# Patient Record
Sex: Female | Born: 1955 | Race: White | Hispanic: No | Marital: Married | Smoking: Former smoker
Health system: Southern US, Community
[De-identification: ages and names within clinical notes are randomized; demographics above are authoritative.]

## PROBLEM LIST (undated history)

## (undated) DIAGNOSIS — E079 Disorder of thyroid, unspecified: Secondary | ICD-10-CM

## (undated) HISTORY — DX: Disorder of thyroid, unspecified: E07.9

## (undated) HISTORY — PX: TUBAL LIGATION: SHX77

---

## 1998-06-19 ENCOUNTER — Ambulatory Visit (HOSPITAL_COMMUNITY): Admission: RE | Admit: 1998-06-19 | Discharge: 1998-06-19 | Payer: Self-pay | Admitting: Obstetrics and Gynecology

## 1998-06-19 ENCOUNTER — Encounter: Payer: Self-pay | Admitting: Obstetrics and Gynecology

## 1998-06-28 ENCOUNTER — Ambulatory Visit (HOSPITAL_COMMUNITY): Admission: RE | Admit: 1998-06-28 | Discharge: 1998-06-28 | Payer: Self-pay | Admitting: Obstetrics and Gynecology

## 1998-06-28 ENCOUNTER — Encounter: Payer: Self-pay | Admitting: Obstetrics and Gynecology

## 1998-08-27 ENCOUNTER — Other Ambulatory Visit: Admission: RE | Admit: 1998-08-27 | Discharge: 1998-08-27 | Payer: Self-pay | Admitting: Obstetrics and Gynecology

## 1999-03-07 ENCOUNTER — Encounter: Payer: Self-pay | Admitting: Obstetrics and Gynecology

## 1999-03-07 ENCOUNTER — Ambulatory Visit (HOSPITAL_COMMUNITY): Admission: RE | Admit: 1999-03-07 | Discharge: 1999-03-07 | Payer: Self-pay | Admitting: Obstetrics and Gynecology

## 2000-12-01 ENCOUNTER — Other Ambulatory Visit: Admission: RE | Admit: 2000-12-01 | Discharge: 2000-12-01 | Payer: Self-pay | Admitting: Obstetrics and Gynecology

## 2002-03-08 ENCOUNTER — Other Ambulatory Visit: Admission: RE | Admit: 2002-03-08 | Discharge: 2002-03-08 | Payer: Self-pay | Admitting: Obstetrics and Gynecology

## 2002-10-18 ENCOUNTER — Encounter: Admission: RE | Admit: 2002-10-18 | Discharge: 2003-01-16 | Payer: Self-pay

## 2002-12-12 ENCOUNTER — Encounter: Payer: Self-pay | Admitting: Obstetrics and Gynecology

## 2002-12-12 ENCOUNTER — Ambulatory Visit (HOSPITAL_COMMUNITY): Admission: RE | Admit: 2002-12-12 | Discharge: 2002-12-12 | Payer: Self-pay | Admitting: Obstetrics and Gynecology

## 2003-03-22 ENCOUNTER — Encounter: Admission: RE | Admit: 2003-03-22 | Discharge: 2003-06-20 | Payer: Self-pay

## 2003-11-16 ENCOUNTER — Other Ambulatory Visit: Admission: RE | Admit: 2003-11-16 | Discharge: 2003-11-16 | Payer: Self-pay | Admitting: Obstetrics and Gynecology

## 2005-08-04 ENCOUNTER — Ambulatory Visit (HOSPITAL_COMMUNITY): Admission: RE | Admit: 2005-08-04 | Discharge: 2005-08-04 | Payer: Self-pay | Admitting: Obstetrics and Gynecology

## 2005-12-01 ENCOUNTER — Other Ambulatory Visit: Admission: RE | Admit: 2005-12-01 | Discharge: 2005-12-01 | Payer: Self-pay | Admitting: Obstetrics and Gynecology

## 2007-03-31 ENCOUNTER — Ambulatory Visit (HOSPITAL_COMMUNITY): Admission: RE | Admit: 2007-03-31 | Discharge: 2007-03-31 | Payer: Self-pay | Admitting: Obstetrics and Gynecology

## 2007-05-30 ENCOUNTER — Other Ambulatory Visit: Admission: RE | Admit: 2007-05-30 | Discharge: 2007-05-30 | Payer: Self-pay | Admitting: Obstetrics and Gynecology

## 2009-08-30 ENCOUNTER — Ambulatory Visit (HOSPITAL_COMMUNITY): Admission: RE | Admit: 2009-08-30 | Discharge: 2009-08-30 | Payer: Self-pay | Admitting: Obstetrics and Gynecology

## 2010-05-28 ENCOUNTER — Encounter: Admission: RE | Admit: 2010-05-28 | Discharge: 2010-05-28 | Payer: Self-pay | Admitting: Family Medicine

## 2010-08-03 ENCOUNTER — Encounter: Payer: Self-pay | Admitting: Obstetrics and Gynecology

## 2012-12-13 ENCOUNTER — Other Ambulatory Visit: Payer: Self-pay | Admitting: Obstetrics and Gynecology

## 2012-12-13 DIAGNOSIS — Z1231 Encounter for screening mammogram for malignant neoplasm of breast: Secondary | ICD-10-CM

## 2012-12-15 ENCOUNTER — Ambulatory Visit (HOSPITAL_COMMUNITY)
Admission: RE | Admit: 2012-12-15 | Discharge: 2012-12-15 | Disposition: A | Discharge status: Home / Self Care | Payer: BC Managed Care – PPO | Source: Ambulatory Visit | Attending: Obstetrics and Gynecology | Admitting: Obstetrics and Gynecology

## 2012-12-15 DIAGNOSIS — Z1231 Encounter for screening mammogram for malignant neoplasm of breast: Secondary | ICD-10-CM

## 2012-12-19 ENCOUNTER — Other Ambulatory Visit: Payer: Self-pay | Admitting: Obstetrics and Gynecology

## 2012-12-19 DIAGNOSIS — R928 Other abnormal and inconclusive findings on diagnostic imaging of breast: Secondary | ICD-10-CM

## 2012-12-21 ENCOUNTER — Telehealth: Payer: Self-pay | Admitting: Obstetrics and Gynecology

## 2012-12-21 NOTE — Telephone Encounter (Signed)
Pt would like results of mammogram

## 2012-12-21 NOTE — Telephone Encounter (Signed)
Patient has received call from [facility] regarding need for additional images and she had some concerns.  Follow up appt is not until 01-03-13.  She did not feel like she received much information about this process from [facility] center and she was told that our office provides final results so she wanted to know when she could expect those.  Also with questions regarding what will be done at follow up appt.  Advised that appt usually includes time for a breast ultrasound to be done if needed and that although I cant answer how soon she will get results, they will come from [facility] and they are usually sensitive to the anxiety patients feel while waiting so they are efficient with results.  Advised she can call with questions or concerns and we can facilitate when/if needed.

## 2013-01-03 ENCOUNTER — Ambulatory Visit
Admission: RE | Admit: 2013-01-03 | Discharge: 2013-01-03 | Disposition: A | Discharge status: Home / Self Care | Payer: BC Managed Care – PPO | Source: Ambulatory Visit | Attending: Obstetrics and Gynecology | Admitting: Obstetrics and Gynecology

## 2013-01-03 DIAGNOSIS — R928 Other abnormal and inconclusive findings on diagnostic imaging of breast: Secondary | ICD-10-CM

## 2015-09-26 ENCOUNTER — Encounter: Payer: Self-pay | Admitting: Obstetrics and Gynecology

## 2015-09-26 ENCOUNTER — Ambulatory Visit (INDEPENDENT_AMBULATORY_CARE_PROVIDER_SITE_OTHER): Payer: BLUE CROSS/BLUE SHIELD | Admitting: Obstetrics and Gynecology

## 2015-09-26 VITALS — BP 92/60 | HR 64 | Resp 14 | Ht 69.5 in | Wt 183.0 lb

## 2015-09-26 DIAGNOSIS — Z01419 Encounter for gynecological examination (general) (routine) without abnormal findings: Secondary | ICD-10-CM | POA: Diagnosis not present

## 2015-09-26 DIAGNOSIS — Z124 Encounter for screening for malignant neoplasm of cervix: Secondary | ICD-10-CM

## 2015-09-26 NOTE — Progress Notes (Signed)
Patient ID: Barbara Black, female   DOB: [birth year], 60 y.o.   MRN: [id number] 60 y.o. [id number] MarriedCaucasianF here for annual exam.  She is sexually active without penetration, husband with ED. She has noticed her vagina is tighter and dryer, not a problem. No vaginal bleeding. No vasomotor symptoms.  She is sensitive to cold. She does have a thyroid condition and is on thyroid medication. Primary follows, levels are normal.     No LMP recorded. Patient is postmenopausal.          Sexually active: yes The current method of family planning is tubal ligation.    Exercising: Yes.    walking Smoker:  former  Health Maintenance: Pap:  2013 WNL per patient History of abnormal Pap:  no MMG:  07/2015 WNL per patient Colonoscopy:  2010 polyps, due at 62 BMD:   Never TDaP:  Unsure, she will check with her primary.  Gardasil: N/A   reports that she has quit smoking. Her smoking use included Cigarettes. She has a 5 pack-year smoking history. She has never used smokeless tobacco. She reports that she drinks alcohol. She reports that she does not use illicit drugs.Just occasional ETOH. 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 2 grown sons, 4 grandchildren.   Past Medical History  Diagnosis Date  . Thyroid disease     Past Surgical History  Procedure Laterality Date  . Cesarean section    . Tubal ligation      Current Outpatient Prescriptions  Medication Sig Dispense Refill  . thyroid (ARMOUR THYROID) 30 MG tablet      No current facility-administered medications for this visit.    Family History  Problem Relation Age of Onset  . Congestive Heart Failure Mother   . Cancer Father   . Stomach cancer Father   . Cancer Brother   Brother had myasthenia gravis, died at 19  Review of Systems  Constitutional: Negative.   HENT: Negative.   Eyes: Negative.   Respiratory: Negative.   Cardiovascular: Negative.   Gastrointestinal: Negative.   Endocrine: Negative.    Genitourinary: Negative.   Musculoskeletal: Negative.   Skin: Negative.   Allergic/Immunologic: Negative.   Neurological: Negative.   Psychiatric/Behavioral: Negative.   Rare GSI with a URI  Exam:   BP 92/60 mmHg  Pulse 64  Resp 14  Ht 5' 9.5" (1.765 m)  Wt 183 lb (83.008 kg)  BMI 26.65 kg/m2  Weight change: @ Height:   Height: 5' 9.5" (176.5 cm)  Ht Readings from Last 3 Encounters:  09/26/15 5' 9.5" (1.765 m)    General appearance: alert, cooperative and appears stated age Head: Normocephalic, without obvious abnormality, atraumatic Neck: no adenopathy, supple, symmetrical, trachea midline and thyroid normal to inspection and palpation Lungs: clear to auscultation bilaterally Breasts: normal appearance, no masses or tenderness Heart: regular rate and rhythm Abdomen: soft, non-tender; bowel sounds normal; no masses,  no organomegaly Extremities: extremities normal, atraumatic, no cyanosis or edema Skin: Skin color, texture, turgor normal. No rashes or lesions Lymph nodes: Cervical, supraclavicular, and axillary nodes normal. No abnormal inguinal nodes palpated Neurologic: Grossly normal   Pelvic: External genitalia:  no lesions              Urethra:  normal appearing urethra with no masses, tenderness or lesions              Bartholins and Skenes: normal                 Vagina: atrophic  Cervix: no lesions and stenotic               Bimanual Exam:  Uterus:  normal size, contour, position, consistency, mobility, non-tender and anteverted              Adnexa: no mass, fullness, tenderness               Rectovaginal: Confirms               Anus:  normal sphincter tone, no lesions  Chaperone was present for exam.  A:  Well Woman with normal exam  P:   Pap with hpv  Mammogram and colonoscopy are UTD  Discussed breast self exam  Discussed calcium and vit D intake  Labs and immunizations with primary MD

## 2015-09-26 NOTE — Patient Instructions (Signed)

## 2015-10-02 LAB — IPS PAP TEST WITH HPV

## 2016-05-04 ENCOUNTER — Telehealth: Payer: Self-pay | Admitting: Obstetrics and Gynecology

## 2016-05-04 NOTE — Telephone Encounter (Signed)
patient experiencing rt breast pain.  Says there is no lump and the pain comes and goes.  Says it almost feels like a pulled muscle.

## 2016-05-04 NOTE — Telephone Encounter (Signed)
Spoke with patient. Patient states that last week she began to notice left right breast discomfort. "It is not pain, but it is noticeable." Reports this sensation occurs intermittently. Nipple is more sensitive. Denies nipple discharge, swelling, redness, or lumps. Advised she will need to be seen in the office for further evaluation. Patient is agreeable. Appointment scheduled for 05/07/2016 at 11:30 am with Dr. Oscar LaJertson. Patient is agreeable to date and time. Aware if new symptoms develop or symptoms worsen she will need to be seen earlier for evaluation. Patient is agreeable.  Routing to provider for final review. Patient agreeable to disposition. Will close encounter.

## 2016-05-07 ENCOUNTER — Ambulatory Visit (INDEPENDENT_AMBULATORY_CARE_PROVIDER_SITE_OTHER): Payer: BLUE CROSS/BLUE SHIELD | Admitting: Obstetrics and Gynecology

## 2016-05-07 ENCOUNTER — Encounter: Payer: Self-pay | Admitting: Obstetrics and Gynecology

## 2016-05-07 VITALS — BP 114/78 | HR 76 | Resp 16 | Ht 69.5 in | Wt 188.0 lb

## 2016-05-07 DIAGNOSIS — E349 Endocrine disorder, unspecified: Secondary | ICD-10-CM | POA: Diagnosis not present

## 2016-05-07 DIAGNOSIS — N644 Mastodynia: Secondary | ICD-10-CM

## 2016-05-07 DIAGNOSIS — L680 Hirsutism: Secondary | ICD-10-CM | POA: Diagnosis not present

## 2016-05-07 DIAGNOSIS — R7989 Other specified abnormal findings of blood chemistry: Secondary | ICD-10-CM

## 2016-05-07 NOTE — Patient Instructions (Signed)

## 2016-05-07 NOTE — Progress Notes (Signed)
GYNECOLOGY  VISIT   HPI: 60 y.o.   Married  Caucasian  female   G2P2002 with Patient's last menstrual period was 04/13/2007 (approximate).   here for right breast soreness on and off x 1 week.   She has pain off and on in her right breast. She has been doing yoga and doesn't know if she strained a muscle. The discomfort is random, almost feels like a pulled muscle, like an ache.  She also had a brief sensation of like she did prior to menopause in her lower abdomen, resolved. No vaginal bleeding.  She has noticed some thinning of her hair, some hair on her face in the last few months. No voice changes. No clitoral changes. She had blood work done at [facility]. This included an elevated free testosterone, normal total testosterone, low vit d, borderline HgbA1C and elevated lipids (labs scanned).   GYNECOLOGIC HISTORY: Patient's last menstrual period was 04/13/2007 (approximate). Contraception: post menopausal  Menopausal hormone therapy: None        OB History    Gravida Para Term Preterm AB Living   2 2 2     2    SAB TAB Ectopic Multiple Live Births           1         There are no active problems to display for this patient.   Past Medical History:  Diagnosis Date  . Thyroid disease     Past Surgical History:  Procedure Laterality Date  . CESAREAN SECTION    . TUBAL LIGATION      Current Outpatient Prescriptions  Medication Sig Dispense Refill  . ascorbic acid (NATURAL C/ROSE HIPS) 500 MG tablet Take 500 mg by mouth daily.    . Biotin 5000 MCG CAPS Take by mouth daily.    . Cholecalciferol (VITAMIN D3) 5000 units CAPS Take by mouth daily.    . Coenzyme Q10 (CO Q 10) 100 MG CAPS Take by mouth daily.    . Cyanocobalamin (VITAMIN B 12 PO) Take 5,000 mcg by mouth daily.    . Gum Guggul, Commiphora mukul, (GUGGUL PLEX PO) Take by mouth daily.    . milk thistle 175 MG tablet Take 175 mg by mouth daily.    . Multiple Vitamin (MULTIVITAMIN) tablet Take 1 tablet by mouth  daily.    . Nutritional Supplements (ADRENAL COMPLEX PO) Take by mouth daily.    . Omega-3 Fatty Acids (FISH OIL) 500 MG CAPS Take by mouth.    . Probiotic Product (PROBIOTIC ACIDOPHILUS BEADS) CAPS Take by mouth daily.    Marland Kitchen thyroid (ARMOUR THYROID) 30 MG tablet      No current facility-administered medications for this visit.      ALLERGIES: Biaxin [clarithromycin] and Codeine  Family History  Problem Relation Age of Onset  . Congestive Heart Failure Mother   . Cancer Father   . Stomach cancer Father   . Cancer Brother     Social History   Social History  . Marital status: Married    Spouse name: N/A  . Number of children: N/A  . Years of education: N/A   Occupational History  . Not on file.   Social History Main Topics  . Smoking status: Former Smoker    Packs/day: 0.50    Years: 10.00    Types: Cigarettes  . Smokeless tobacco: Never Used  . Alcohol use 0.0 oz/week     Comment: rare  . Drug use: No  . Sexual activity:  Not Currently    Partners: Male   Other Topics Concern  . Not on file   Social History Narrative  . No narrative on file    Review of Systems  Constitutional: Positive for weight loss.  HENT: Negative.   Eyes: Negative.   Respiratory: Negative.   Cardiovascular: Negative.   Gastrointestinal: Negative.   Genitourinary: Negative.   Musculoskeletal: Negative.   Skin:       Right Breast Soreness/ pain  Neurological: Negative.   Endo/Heme/Allergies: Negative.   Psychiatric/Behavioral: Negative.     PHYSICAL EXAMINATION:    BP 114/78 (BP Location: Right Arm, Patient Position: Sitting, Cuff Size: Normal)   Pulse 76   Resp 16   Ht 5' 9.5" (1.765 m)   Wt 188 lb (85.3 kg)   LMP 04/13/2007 (Approximate)   BMI 27.36 kg/m     General appearance: alert, cooperative and appears stated age Breasts: no lumps, dimpling or skin changes. She is tender in the upper outer quadrant of the right breast, also tender with palpation of the chest wall  above the breast.  Lymph: no supraclavicular or axillary adenopathy Skin: no clear hirsutism on her face (she removes), moderate hair noted around her nipples bilaterally.  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  15 minutes face to face time of which over 50% was spent in counseling.

## 2016-05-08 LAB — DHEA-SULFATE: DHEA-SO4: 180 ug/dL (ref 8–188)

## 2016-05-10 LAB — 17-HYDROXYPROGESTERONE: 17-OH-Progesterone, LC/MS/MS: 10 ng/dL

## 2016-05-12 ENCOUNTER — Telehealth: Payer: Self-pay | Admitting: *Deleted

## 2016-05-12 NOTE — Telephone Encounter (Signed)
Left message to call regarding lab results -eh 

## 2016-05-12 NOTE — Telephone Encounter (Signed)
-----   Message from Romualdo BolkJill Evelyn Jertson, MD sent at 05/11/2016  2:27 PM EDT ----- Please advise the patient of normal results. Testosterone levels are pending

## 2016-05-13 NOTE — Telephone Encounter (Signed)
Patient is returning a call to Elaine. °

## 2016-05-15 LAB — TESTOS,TOTAL,FREE AND SHBG (FEMALE)
Sex Hormone Binding Glob.: 33 nmol/L (ref 14–73)
Testosterone, Free: 2.8 pg/mL (ref 0.1–6.4)
Testosterone,Total,LC/MS/MS: 20 ng/dL (ref 2–45)

## 2016-05-18 NOTE — Telephone Encounter (Signed)
Please let the patient know that I did review her mammogram reports. No significant changes were noted between 2011, 2014 and 2017. I would recommend that she do yearly 3D mammograms

## 2016-05-18 NOTE — Telephone Encounter (Signed)
Spoke with patient and advised that all her results were normal including her testosterone levels. Patient voiced understanding -eh

## 2016-05-18 NOTE — Telephone Encounter (Signed)
When talking with patient about test results she asked about her last two mammograms. She states that Dr. Oscar LaJertson was going to review them both and compare them. Her last two were from 2014 and 2017. Advised patient that I will send a message to Dr. Oscar LaJertson for review -eh

## 2016-05-18 NOTE — Telephone Encounter (Signed)
Spoke with patient and gave message from Dr. Jertson. Patient voiced understanding -eh 

## 2016-06-18 ENCOUNTER — Emergency Department (HOSPITAL_COMMUNITY): Payer: BLUE CROSS/BLUE SHIELD

## 2016-06-18 ENCOUNTER — Encounter (HOSPITAL_COMMUNITY): Payer: Self-pay | Admitting: Emergency Medicine

## 2016-06-18 ENCOUNTER — Emergency Department (HOSPITAL_COMMUNITY)
Admission: EM | Admit: 2016-06-18 | Discharge: 2016-06-18 | Disposition: A | Discharge status: Home / Self Care | Payer: BLUE CROSS/BLUE SHIELD | Attending: Emergency Medicine | Admitting: Emergency Medicine

## 2016-06-18 DIAGNOSIS — Z87891 Personal history of nicotine dependence: Secondary | ICD-10-CM | POA: Diagnosis not present

## 2016-06-18 DIAGNOSIS — E0580 Other thyrotoxicosis without thyrotoxic crisis or storm: Secondary | ICD-10-CM | POA: Diagnosis not present

## 2016-06-18 DIAGNOSIS — R791 Abnormal coagulation profile: Secondary | ICD-10-CM | POA: Diagnosis not present

## 2016-06-18 DIAGNOSIS — H539 Unspecified visual disturbance: Secondary | ICD-10-CM

## 2016-06-18 DIAGNOSIS — Z79899 Other long term (current) drug therapy: Secondary | ICD-10-CM | POA: Insufficient documentation

## 2016-06-18 DIAGNOSIS — H538 Other visual disturbances: Secondary | ICD-10-CM | POA: Diagnosis present

## 2016-06-18 LAB — CBC
HCT: 47.3 % — ABNORMAL HIGH (ref 36.0–46.0)
Hemoglobin: 15.8 g/dL — ABNORMAL HIGH (ref 12.0–15.0)
MCH: 27.3 pg (ref 26.0–34.0)
MCHC: 33.4 g/dL (ref 30.0–36.0)
MCV: 81.8 fL (ref 78.0–100.0)
PLATELETS: 255 10*3/uL (ref 150–400)
RBC: 5.78 MIL/uL — AB (ref 3.87–5.11)
RDW: 13.6 % (ref 11.5–15.5)
WBC: 9.4 10*3/uL (ref 4.0–10.5)

## 2016-06-18 LAB — URINALYSIS, ROUTINE W REFLEX MICROSCOPIC
Bacteria, UA: NONE SEEN
Bilirubin Urine: NEGATIVE
Glucose, UA: NEGATIVE mg/dL
HGB URINE DIPSTICK: NEGATIVE
Ketones, ur: NEGATIVE mg/dL
Nitrite: NEGATIVE
PH: 7 (ref 5.0–8.0)
Protein, ur: NEGATIVE mg/dL
SPECIFIC GRAVITY, URINE: 1.005 (ref 1.005–1.030)
Squamous Epithelial / LPF: NONE SEEN

## 2016-06-18 LAB — APTT: APTT: 28 s (ref 24–36)

## 2016-06-18 LAB — DIFFERENTIAL
BASOS PCT: 1 %
Basophils Absolute: 0.1 10*3/uL (ref 0.0–0.1)
Eosinophils Absolute: 0.2 10*3/uL (ref 0.0–0.7)
Eosinophils Relative: 2 %
LYMPHS PCT: 36 %
Lymphs Abs: 3.4 10*3/uL (ref 0.7–4.0)
Monocytes Absolute: 0.4 10*3/uL (ref 0.1–1.0)
Monocytes Relative: 4 %
NEUTROS ABS: 5.4 10*3/uL (ref 1.7–7.7)
Neutrophils Relative %: 57 %

## 2016-06-18 LAB — I-STAT CHEM 8, ED
BUN: 18 mg/dL (ref 6–20)
CALCIUM ION: 1.25 mmol/L (ref 1.15–1.40)
CHLORIDE: 102 mmol/L (ref 101–111)
Creatinine, Ser: 0.6 mg/dL (ref 0.44–1.00)
GLUCOSE: 94 mg/dL (ref 65–99)
HCT: 51 % — ABNORMAL HIGH (ref 36.0–46.0)
Hemoglobin: 17.3 g/dL — ABNORMAL HIGH (ref 12.0–15.0)
POTASSIUM: 3.6 mmol/L (ref 3.5–5.1)
SODIUM: 143 mmol/L (ref 135–145)
TCO2: 27 mmol/L (ref 0–100)

## 2016-06-18 LAB — RAPID URINE DRUG SCREEN, HOSP PERFORMED
AMPHETAMINES: NOT DETECTED
BARBITURATES: NOT DETECTED
BENZODIAZEPINES: NOT DETECTED
Cocaine: NOT DETECTED
Opiates: NOT DETECTED
TETRAHYDROCANNABINOL: NOT DETECTED

## 2016-06-18 LAB — I-STAT TROPONIN, ED: Troponin i, poc: 0 ng/mL (ref 0.00–0.08)

## 2016-06-18 LAB — COMPREHENSIVE METABOLIC PANEL
ALBUMIN: 5.3 g/dL — AB (ref 3.5–5.0)
ALK PHOS: 79 U/L (ref 38–126)
ALT: 30 U/L (ref 14–54)
AST: 28 U/L (ref 15–41)
Anion gap: 10 (ref 5–15)
BUN: 18 mg/dL (ref 6–20)
CALCIUM: 10.1 mg/dL (ref 8.9–10.3)
CHLORIDE: 104 mmol/L (ref 101–111)
CO2: 27 mmol/L (ref 22–32)
CREATININE: 0.7 mg/dL (ref 0.44–1.00)
GFR calc Af Amer: >60 mL/min (ref >60)
GFR calc non Af Amer: >60 mL/min (ref >60)
GLUCOSE: 97 mg/dL (ref 65–99)
Potassium: 3.7 mmol/L (ref 3.5–5.1)
SODIUM: 141 mmol/L (ref 135–145)
Total Bilirubin: 0.3 mg/dL (ref 0.3–1.2)
Total Protein: 8.8 g/dL — ABNORMAL HIGH (ref 6.5–8.1)

## 2016-06-18 LAB — PROTIME-INR
INR: 0.9
PROTHROMBIN TIME: 12.1 s (ref 11.4–15.2)

## 2016-06-18 LAB — ETHANOL

## 2016-06-18 LAB — TSH: TSH: 0.086 u[IU]/mL — AB (ref 0.350–4.500)

## 2016-06-18 NOTE — ED Provider Notes (Signed)
[facility]-EMERGENCY DEPT Provider Note   CSN: [id number] Arrival date & time: 06/18/16  1714     History   Chief Complaint Chief Complaint  Patient presents with  . Visual Field Change    HPI Barbara Black is a 60 y.o. female.  Pt was talking on the phone today around 1615.  She developed a sudden onset of left vision field that pt described as a prism.  The pt said that she later could not see out of the left side of her eye.  She has a headache.  She did not notice any changes to her arms or legs.  She is back to normal now.      Past Medical History:  Diagnosis Date  . Thyroid disease     There are no active problems to display for this patient.   Past Surgical History:  Procedure Laterality Date  . CESAREAN SECTION    . TUBAL LIGATION      OB History    Gravida Para Term Preterm AB Living   2 2 2     2    SAB TAB Ectopic Multiple Live Births           1       Home Medications    Prior to Admission medications   Medication Sig Start Date End Date Taking? Authorizing Provider  ascorbic acid (NATURAL C/ROSE HIPS) 500 MG tablet Take 500 mg by mouth daily.   Yes Historical Provider, MD  Biotin 5000 MCG CAPS Take 5,000 mcg by mouth daily.    Yes Historical Provider, MD  Cholecalciferol (VITAMIN D3) 5000 units CAPS Take 5,000 Units by mouth daily.    Yes Historical Provider, MD  Coenzyme Q10 (CO Q 10) 100 MG CAPS Take 100 mg by mouth daily.    Yes Historical Provider, MD  Cyanocobalamin (VITAMIN B 12 PO) Take 5,000 mcg by mouth daily.   Yes Historical Provider, MD  milk thistle 175 MG tablet Take 175 mg by mouth daily.   Yes Historical Provider, MD  Multiple Vitamin (MULTIVITAMIN) tablet Take 1 tablet by mouth daily.   Yes Historical Provider, MD  Omega-3 Fatty Acids (FISH OIL) 500 MG CAPS Take 500 mg by mouth daily.    Yes Historical Provider, MD  Probiotic Product (PROBIOTIC ACIDOPHILUS BEADS) CAPS Take 1 capsule by mouth daily.    Yes Historical Provider, MD     Family History Family History  Problem Relation Age of Onset  . Congestive Heart Failure Mother   . Cancer Father   . Stomach cancer Father   . Cancer Brother     Social History Social History  Substance Use Topics  . Smoking status: Former Smoker    Packs/day: 0.50    Years: 10.00    Types: Cigarettes  . Smokeless tobacco: Never Used  . Alcohol use 0.0 oz/week     Comment: rare     Allergies   Biaxin [clarithromycin] and Codeine   Review of Systems Review of Systems  Eyes: Positive for visual disturbance.  All other systems reviewed and are negative.    Physical Exam Updated Vital Signs BP 119/91 (BP Location: Left Arm)   Pulse 80   Temp 97.9 F (36.6 C) (Oral)   Resp 24   Ht 6' (1.829 m)   Wt 171 lb 11.2 oz (77.9 kg)   LMP 04/13/2007 (Approximate)   SpO2 99%   BMI 23.29 kg/m   Physical Exam  Constitutional: She is oriented to person,  place, and time. She appears well-developed and well-nourished.  HENT:  Head: Normocephalic and atraumatic.  Right Ear: External ear normal.  Left Ear: External ear normal.  Nose: Nose normal.  Mouth/Throat: Oropharynx is clear and moist.  Eyes: Conjunctivae and EOM are normal. Pupils are equal, round, and reactive to light.  Neck: Normal range of motion. Neck supple.  Cardiovascular: Normal rate, regular rhythm, normal heart sounds and intact distal pulses.   Pulmonary/Chest: Effort normal and breath sounds normal.  Abdominal: Soft. Bowel sounds are normal.  Musculoskeletal: Normal range of motion.  Neurological: She is alert and oriented to person, place, and time.  Skin: Skin is warm and dry.  Psychiatric: She has a normal mood and affect. Her behavior is normal. Judgment and thought content normal.  Nursing note and vitals reviewed.    ED Treatments / Results  Labs (all labs ordered are listed, but only abnormal results are displayed) Labs Reviewed  CBC - Abnormal; Notable for the following:        Result Value   RBC 5.78 (*)    Hemoglobin 15.8 (*)    HCT 47.3 (*)    All other components within normal limits  COMPREHENSIVE METABOLIC PANEL - Abnormal; Notable for the following:    Total Protein 8.8 (*)    Albumin 5.3 (*)    All other components within normal limits  URINALYSIS, ROUTINE W REFLEX MICROSCOPIC - Abnormal; Notable for the following:    Color, Urine STRAW (*)    Leukocytes, UA TRACE (*)    All other components within normal limits  TSH - Abnormal; Notable for the following:    TSH 0.086 (*)    All other components within normal limits  I-STAT CHEM 8, ED - Abnormal; Notable for the following:    Hemoglobin 17.3 (*)    HCT 51.0 (*)    All other components within normal limits  ETHANOL  PROTIME-INR  APTT  DIFFERENTIAL  RAPID URINE DRUG SCREEN, HOSP PERFORMED  I-STAT TROPOININ, ED    EKG  EKG Interpretation  Date/Time:  Thursday June 18 2016 19:08:15 EST Ventricular Rate:  65 PR Interval:    QRS Duration: 94 QT Interval:  415 QTC Calculation: 432 R Axis:   82 Text Interpretation:  Sinus rhythm Baseline wander in lead(s) V6 Confirmed by Hali Balgobin MD, Zeniyah Peaster ([id number]) on 06/18/2016 7:11:05 PM       Radiology Ct Head Wo Contrast  Result Date: 06/18/2016 CLINICAL DATA:  Initial evaluation for acute visual changes. EXAM: CT HEAD WITHOUT CONTRAST TECHNIQUE: Contiguous axial images were obtained from the base of the skull through the vertex without intravenous contrast. COMPARISON:  None. FINDINGS: Brain: Cerebral volume normal. No acute intracranial hemorrhage or infarct. No mass lesion, midline shift or mass effect. No hydrocephalus. No extra-axial fluid collection. Vascular: No hyperdense vessel. Skull: Scalp soft tissues within normal limits.  Calvarium intact. Sinuses/Orbits: Visualized globes and orbits within normal limits. Visualized paranasal sinuses and mastoid air cells are clear. IMPRESSION: Negative head CT.  No acute intracranial process identified.  Electronically Signed   By: Rise Mu M.D.   On: 06/18/2016 19:04   Mr Brain Wo Contrast  Result Date: 06/18/2016 CLINICAL DATA:  Initial evaluation for acute visual disturbance. EXAM: MRI HEAD WITHOUT CONTRAST MRA HEAD WITHOUT CONTRAST TECHNIQUE: Multiplanar, multiecho pulse sequences of the brain and surrounding structures were obtained without intravenous contrast. Angiographic images of the head were obtained using MRA technique without contrast. COMPARISON:  None. FINDINGS: MRI HEAD FINDINGS Brain:  Cerebral volume within normal limits. No significant cerebral white matter disease for patient age. No abnormal foci of restricted diffusion to suggest acute or subacute ischemia. Gray-white matter differentiation maintained. No evidence for acute or chronic intracranial hemorrhage. No encephalomalacia to suggest chronic infarction. No mass lesion, midline shift or mass effect. No hydrocephalus. No extra-axial fluid collection. Major dural sinuses are grossly patent. Pituitary gland mildly prominent but felt to be within normal limits. Midline structures intact. Vascular: Major intracranial vascular flow voids are maintained. Skull and upper cervical spine: Craniocervical junction within normal limits. Visualized upper cervical spine unremarkable. Bone marrow signal intensity within normal limits. No scalp soft tissue abnormality. Sinuses/Orbits: Globes and orbital soft tissues demonstrate no acute abnormality. Paranasal sinuses are clear. No mastoid effusion. Inner ear structures within normal limits. MRA HEAD FINDINGS ANTERIOR CIRCULATION: Distal cervical segments of the internal carotid arteries are patent with antegrade flow. Petrous, cavernous, and supraclinoid segments patent without flow limiting stenosis. A1 segments patent. Anterior communicating artery normal. Anterior cerebral arteries patent. M1 segments patent without stenosis or occlusion. MCA bifurcations normal. Distal MCA branches well  opacified and symmetric. POSTERIOR CIRCULATION: Vertebral arteries patent to the vertebrobasilar junction. Posterior inferior cerebral arteries patent proximally. Basilar artery widely patent. Superior cerebellar and posterior cerebral arteries patent bilaterally. No aneurysm or vascular malformation. IMPRESSION: Normal MRI/MRA of the brain. Electronically Signed   By: Rise MuBenjamin  McClintock M.D.   On: 06/18/2016 22:14   Mr Maxine GlennMra Head/brain ZOWo Cm  Result Date: 06/18/2016 CLINICAL DATA:  Initial evaluation for acute visual disturbance. EXAM: MRI HEAD WITHOUT CONTRAST MRA HEAD WITHOUT CONTRAST TECHNIQUE: Multiplanar, multiecho pulse sequences of the brain and surrounding structures were obtained without intravenous contrast. Angiographic images of the head were obtained using MRA technique without contrast. COMPARISON:  None. FINDINGS: MRI HEAD FINDINGS Brain: Cerebral volume within normal limits. No significant cerebral white matter disease for patient age. No abnormal foci of restricted diffusion to suggest acute or subacute ischemia. Gray-white matter differentiation maintained. No evidence for acute or chronic intracranial hemorrhage. No encephalomalacia to suggest chronic infarction. No mass lesion, midline shift or mass effect. No hydrocephalus. No extra-axial fluid collection. Major dural sinuses are grossly patent. Pituitary gland mildly prominent but felt to be within normal limits. Midline structures intact. Vascular: Major intracranial vascular flow voids are maintained. Skull and upper cervical spine: Craniocervical junction within normal limits. Visualized upper cervical spine unremarkable. Bone marrow signal intensity within normal limits. No scalp soft tissue abnormality. Sinuses/Orbits: Globes and orbital soft tissues demonstrate no acute abnormality. Paranasal sinuses are clear. No mastoid effusion. Inner ear structures within normal limits. MRA HEAD FINDINGS ANTERIOR CIRCULATION: Distal cervical  segments of the internal carotid arteries are patent with antegrade flow. Petrous, cavernous, and supraclinoid segments patent without flow limiting stenosis. A1 segments patent. Anterior communicating artery normal. Anterior cerebral arteries patent. M1 segments patent without stenosis or occlusion. MCA bifurcations normal. Distal MCA branches well opacified and symmetric. POSTERIOR CIRCULATION: Vertebral arteries patent to the vertebrobasilar junction. Posterior inferior cerebral arteries patent proximally. Basilar artery widely patent. Superior cerebellar and posterior cerebral arteries patent bilaterally. No aneurysm or vascular malformation. IMPRESSION: Normal MRI/MRA of the brain. Electronically Signed   By: Rise MuBenjamin  McClintock M.D.   On: 06/18/2016 22:14    Procedures Procedures (including critical care time)  Medications Ordered in ED Medications - No data to display   Initial Impression / Assessment and Plan / ED Course  I have reviewed the triage vital signs and the nursing notes.  Pertinent  labs & imaging results that were available during my care of the patient were reviewed by me and considered in my medical decision making (see chart for details).  Clinical Course     Pt has remained symptom free.  I told her to hold her thyroid medications and f/u with her pcp.  She is also given the number to ophthalmology to f/u.  She knows to return if worse.    Visual Acuity  Right Eye Distance:   Left Eye Distance:   Bilateral Distance:    Right Eye Near: R Near: 20/50 Left Eye Near:  L Near: 20/50 Bilateral Near:     Final Clinical Impressions(s) / ED Diagnoses   Final diagnoses:  Secondary hyperthyroidism  Visual disturbance    New Prescriptions Current Discharge Medication List       Jacalyn Lefevre, MD 06/18/16 [id number]

## 2016-06-18 NOTE — ED Notes (Signed)
Pt in bathroom, unable to triage.

## 2016-06-18 NOTE — ED Triage Notes (Signed)
Pt reports feeling unusual burst of energy followed by  seeing prism-like points of light forming a crescent shape across middle/left visual field onset 1615. Pt later noted that when looking at her husband she could not see his left side. Prism persisting. Headache to upper left head, feeling less mentally acute than normal. No confusion, weakness, nausea. Vision currently normal. CN II-XII grossly intact.

## 2016-06-18 NOTE — ED Notes (Signed)
Patient transported to MRI 

## 2016-06-18 NOTE — ED Notes (Signed)
Patient transported to CT 

## 2017-04-07 IMAGING — CT CT HEAD W/O CM
3 of 4 series · 15 of 47 positions shown, 18 images · non-contrast
Comparison: None.

CLINICAL DATA: Initial evaluation for acute visual changes.

EXAM:
CT HEAD WITHOUT CONTRAST
TECHNIQUE: Contiguous axial images were obtained from the base of the skull
through the vertex without intravenous contrast.

[Series 2: head w/o · axial · non-contrast · 0.43mm/px · z∈[+1811,+1936]mm · 9 of 31 slices shown, 12 images]
[im 3/31  brain]
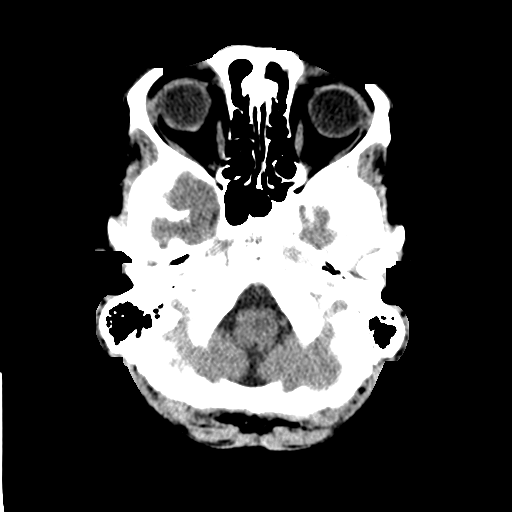
[im 3/31  bone]
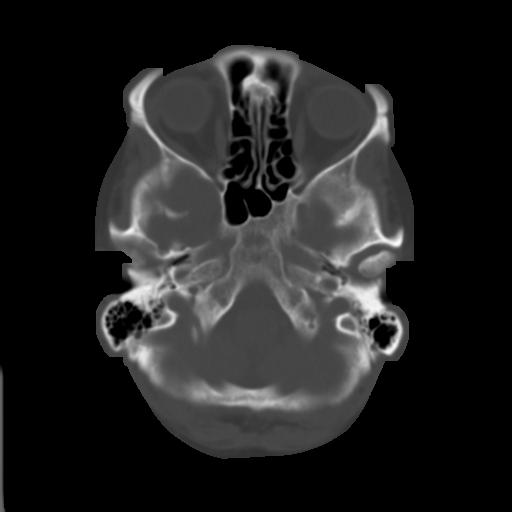
[im 7/31  brain]
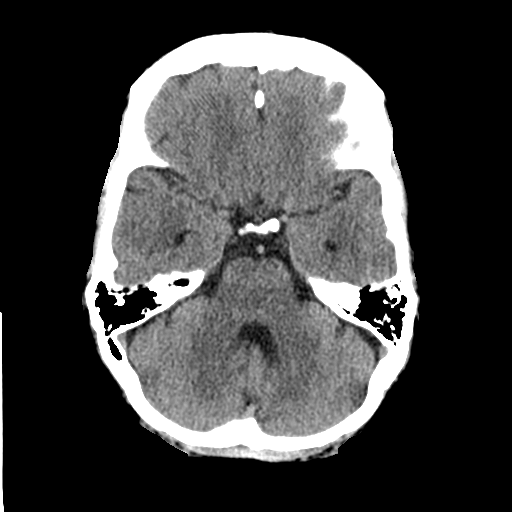
[im 9/31  brain]
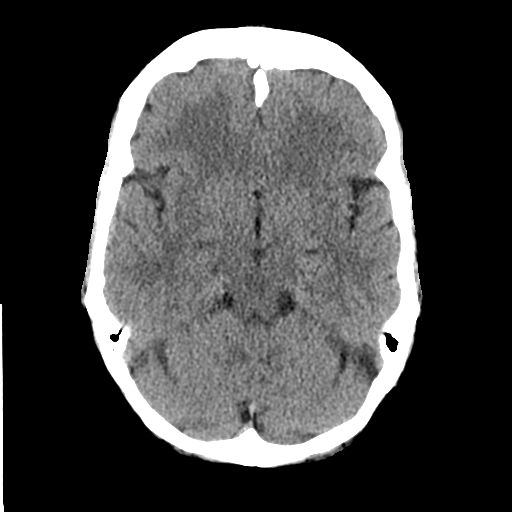
[im 13/31  brain]
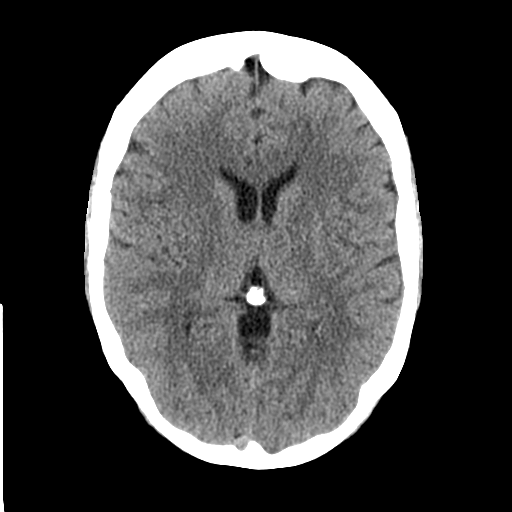
[im 16/31  brain]
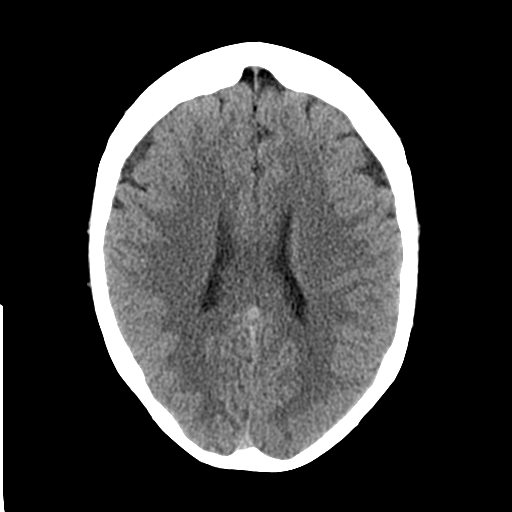
[im 16/31  bone]
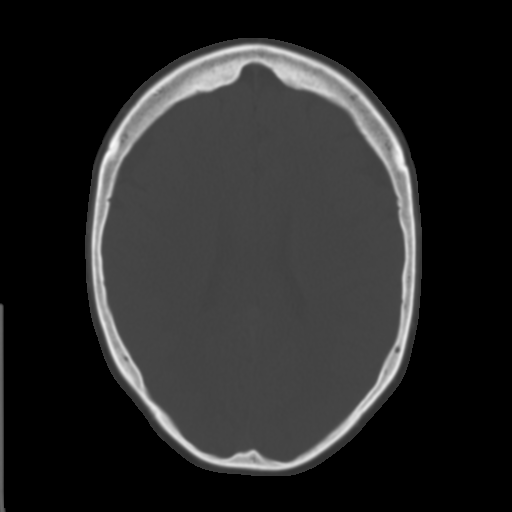
[im 18/31  brain]
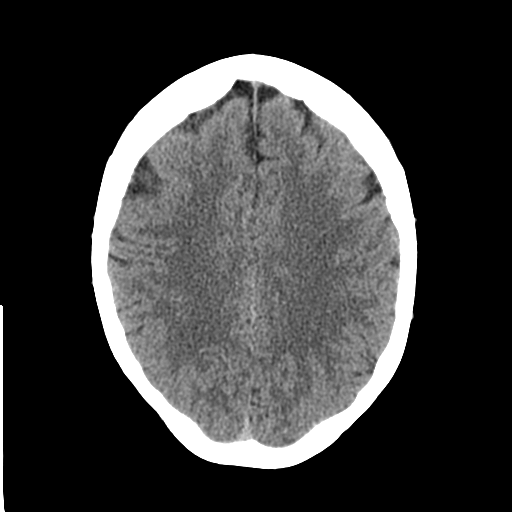
[im 22/31  brain]
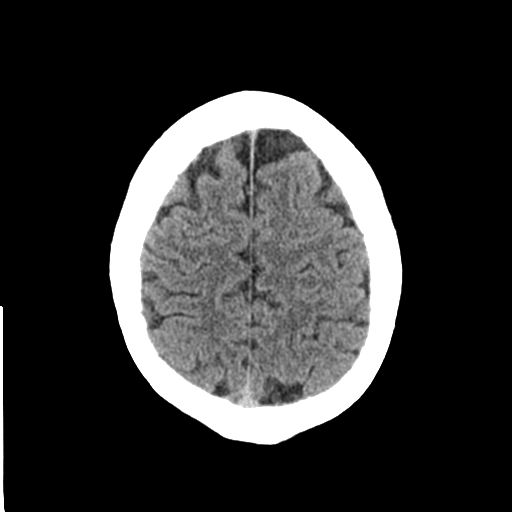
[im 24/31  brain]
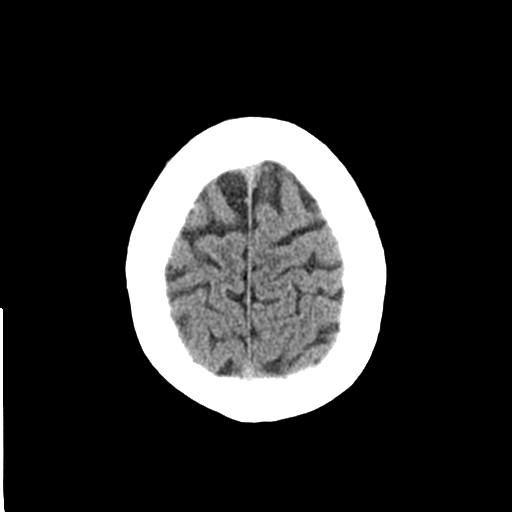
[im 28/31  brain]
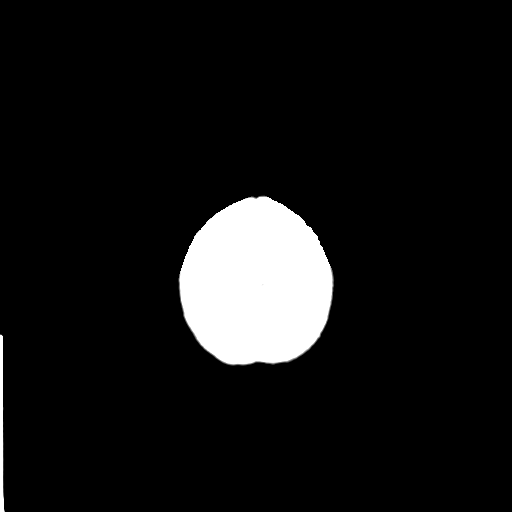
[im 28/31  bone]
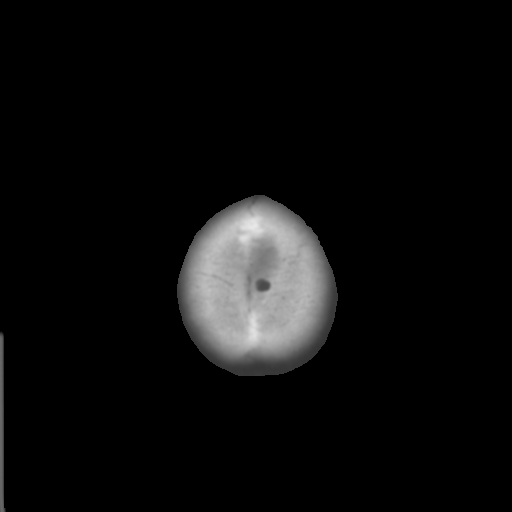

[Series 5: coronal · coronal · 0.28mm/px · 3 of 68 slices shown]
[im 23/68  brain]
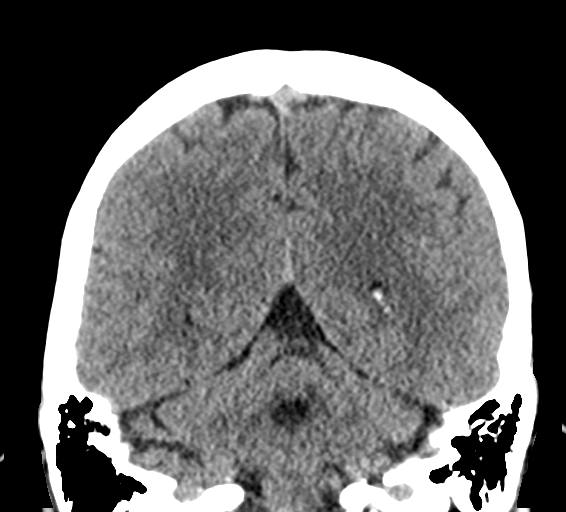
[im 30/68  brain]
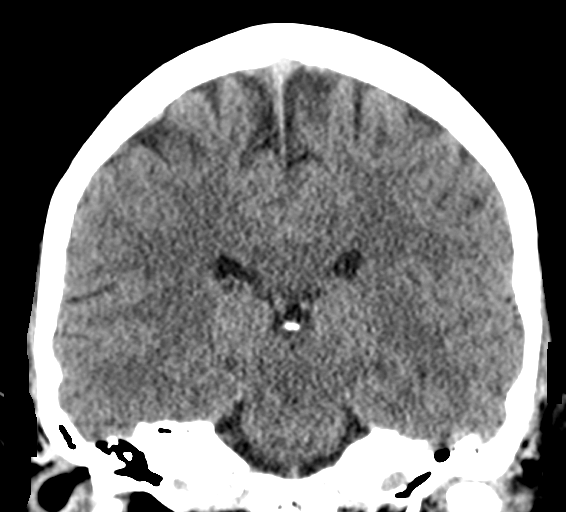
[im 38/68  brain]
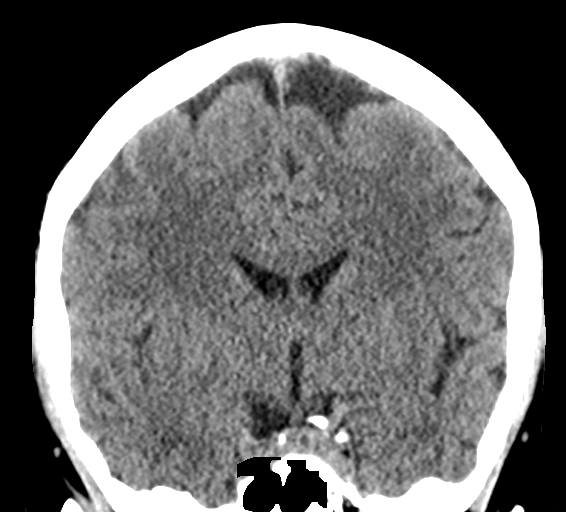

[Series 6: sagittal · sagittal · 0.28mm/px · 3 of 53 slices shown]
[im 18/53  brain]
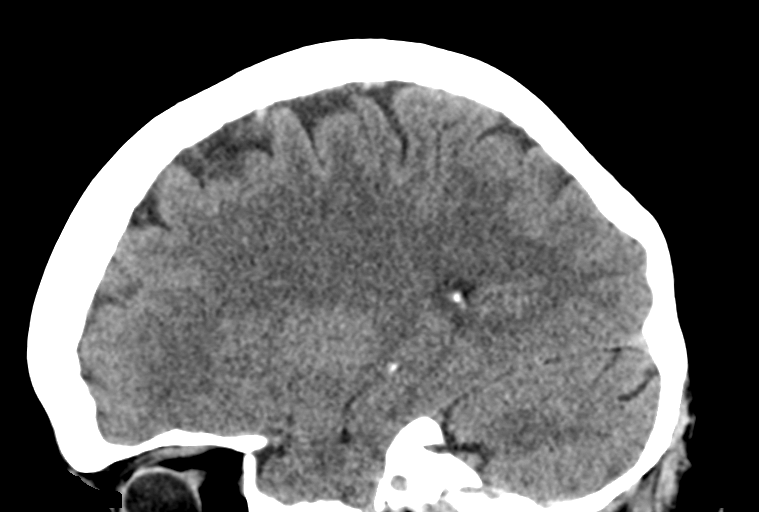
[im 27/53  brain]
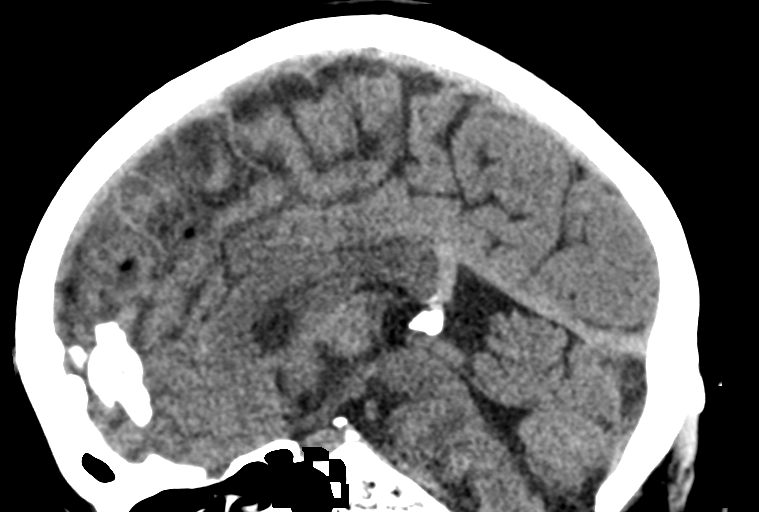
[im 35/53  brain]
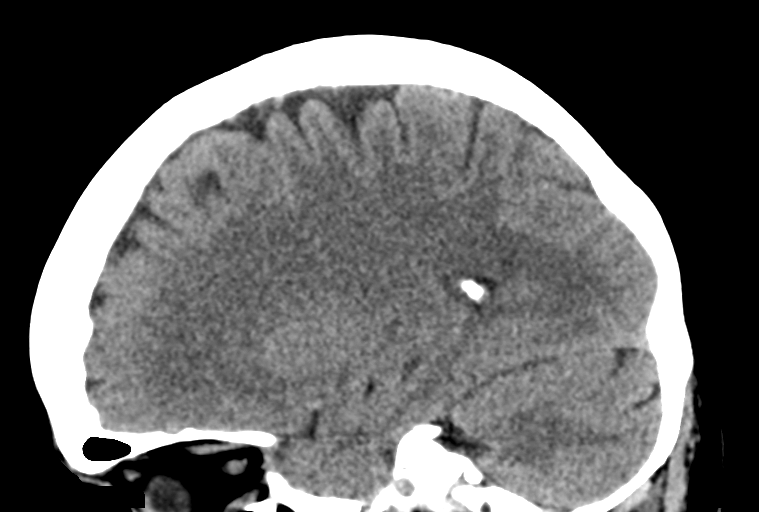

[15 of 47 positions shown; findings below may reference images not displayed]

FINDINGS: Brain: Cerebral volume normal. No acute intracranial hemorrhage or
infarct. No mass lesion, midline shift or mass effect. No
hydrocephalus. No extra-axial fluid collection.

Vascular: No hyperdense vessel.

Skull: Scalp soft tissues within normal limits.  Calvarium intact.

Sinuses/Orbits: Visualized globes and orbits within normal limits.
Visualized paranasal sinuses and mastoid air cells are clear.
IMPRESSION: Negative head CT.  No acute intracranial process identified.

## 2017-11-15 ENCOUNTER — Encounter: Payer: Self-pay | Admitting: Obstetrics and Gynecology

## 2019-06-19 ENCOUNTER — Encounter: Payer: Self-pay | Admitting: Obstetrics and Gynecology

## 2019-07-03 NOTE — Progress Notes (Signed)
63 y.o. G1P2002 Married White or Caucasian Not Hispanic or Latino female here for annual exam.   She has issues with entry dyspareunia. Husband with medical issues and ED. They are sexually active without penile penetration, but she has some pain with insertion of a vibrator.  No vaginal bleeding. No bowel or bladder issues.     Patient's last menstrual period was 04/13/2007 (lmp unknown).          Sexually active: Yes.    The current method of family planning is post menopausal status.    Exercising: Yes.    walking, rowing Smoker:  no  Health Maintenance: Pap:  09/26/2015 WNL NEG HPV, 2013 WNL per patient History of abnormal Pap:  no MMG:  11/03/2017 Birads 1 negative, reports she just had one done with [facility] Colonoscopy:  2010 polyps, due, she wants to wait until the spring.  BMD:   Never TDaP:  Unsure, she will check with her primary.  Gardasil: N/A   reports that she has quit smoking. Her smoking use included cigarettes. She has a 5.00 pack-year smoking history. She has never used smokeless tobacco. She reports previous alcohol use. She reports that she does not use drugs.No ETOH. 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 2 grown sons, 4 grandchildren  Past Medical History:  Diagnosis Date  . Thyroid disease     Past Surgical History:  Procedure Laterality Date  . CESAREAN SECTION    . TUBAL LIGATION      Current Outpatient Medications  Medication Sig Dispense Refill  . ARMOUR THYROID 90 MG tablet Take 90 mg by mouth daily.    . Biotin 5000 MCG CAPS Take 5,000 mcg by mouth daily.     . Cholecalciferol (VITAMIN D3) 5000 units CAPS Take 5,000 Units by mouth daily.     . Cyanocobalamin (VITAMIN B 12 PO) Take 5,000 mcg by mouth daily.    . Multiple Vitamin (MULTIVITAMIN) tablet Take 1 tablet by mouth daily.    . Omega-3 Fatty Acids (FISH OIL) 500 MG CAPS Take 500 mg by mouth daily.     . Probiotic Product (PROBIOTIC ACIDOPHILUS BEADS) CAPS Take 1 capsule by mouth daily.       No current facility-administered medications for this visit.    Family History  Problem Relation Age of Onset  . Congestive Heart Failure Mother   . Cancer Father   . Stomach cancer Father   . Cancer Brother     Review of Systems  Constitutional: Negative.   HENT: Negative.   Eyes: Negative.   Respiratory: Negative.   Cardiovascular: Negative.   Gastrointestinal: Negative.   Endocrine: Negative.   Genitourinary: Positive for dyspareunia.  Musculoskeletal: Negative.   Skin: Negative.   Allergic/Immunologic: Negative.   Neurological: Negative.   Hematological: Negative.   Psychiatric/Behavioral: Negative.     Exam:   BP 118/78 (BP Location: Left Arm, Patient Position: Sitting, Cuff Size: Normal)   Pulse 64   Temp 97.9 F (36.6 C) (Skin)   Ht 5\' 10"  (1.778 m)   Wt 181 lb 6.4 oz (82.3 kg)   LMP 04/13/2007 (LMP Unknown)   BMI 26.03 kg/m   Weight change: @WEIGHTCHANGE @ Height:   Height: 5\' 10"  (177.8 cm)  Ht Readings from Last 3 Encounters:  07/10/19 5\' 10"  (1.778 m)  06/18/16 6' (1.829 m)  05/07/16 5' 9.5" (1.765 m)    General appearance: alert, cooperative and appears stated age Head: Normocephalic, without obvious abnormality, atraumatic Neck: no adenopathy, supple, symmetrical, trachea  midline and thyroid normal to inspection and palpation Lungs: clear to auscultation bilaterally Cardiovascular: regular rate and rhythm Breasts: normal appearance, no masses or tenderness Abdomen: soft, non-tender; non distended,  no masses,  no organomegaly Extremities: extremities normal, atraumatic, no cyanosis or edema Skin: Skin color, texture, turgor normal. No rashes or lesions Lymph nodes: Cervical, supraclavicular, and axillary nodes normal. No abnormal inguinal nodes palpated Neurologic: Grossly normal   Pelvic: External genitalia:  no lesions              Urethra:  normal appearing urethra with no masses, tenderness or lesions              Bartholins and Skenes:  normal                 Vagina: normal appearing vagina with normal color and discharge, no lesions              Cervix: no lesions               Bimanual Exam:  Uterus:  normal size, contour, position, consistency, mobility, non-tender and retroverted              Adnexa: no mass, fullness, tenderness               Rectovaginal: Confirms               Anus:  normal sphincter tone, no lesions  Kaitlyn Sprague chaperoned for the exam.  A:  Well Woman with normal exam  Entry dyspareunia, vaginal atrophy  P:   No pap this year  Mammogram just done  Colonoscopy due (she will call when she knows where she knows where she wants her referral sent)  Discussed breast self exam  Discussed calcium and vit D intake  Discussed using a lubricant with sexual activity, discussed the option of vaginal dilators, vaginal estrogen (declines), recommended she get a smaller vibrator.

## 2019-07-05 ENCOUNTER — Other Ambulatory Visit: Payer: Self-pay

## 2019-07-10 ENCOUNTER — Encounter: Payer: Self-pay | Admitting: Obstetrics and Gynecology

## 2019-07-10 ENCOUNTER — Ambulatory Visit: Payer: BC Managed Care – PPO | Admitting: Obstetrics and Gynecology

## 2019-07-10 ENCOUNTER — Other Ambulatory Visit: Payer: Self-pay

## 2019-07-10 VITALS — BP 118/78 | HR 64 | Temp 97.9°F | Ht 70.0 in | Wt 181.4 lb

## 2019-07-10 DIAGNOSIS — Z1211 Encounter for screening for malignant neoplasm of colon: Secondary | ICD-10-CM

## 2019-07-10 DIAGNOSIS — N9411 Superficial (introital) dyspareunia: Secondary | ICD-10-CM | POA: Diagnosis not present

## 2019-07-10 DIAGNOSIS — N952 Postmenopausal atrophic vaginitis: Secondary | ICD-10-CM | POA: Diagnosis not present

## 2019-07-10 DIAGNOSIS — Z01419 Encounter for gynecological examination (general) (routine) without abnormal findings: Secondary | ICD-10-CM

## 2019-07-10 NOTE — Patient Instructions (Signed)

## 2020-07-18 ENCOUNTER — Other Ambulatory Visit: Payer: BLUE CROSS/BLUE SHIELD

## 2020-07-18 DIAGNOSIS — Z20822 Contact with and (suspected) exposure to covid-19: Secondary | ICD-10-CM

## 2020-07-20 LAB — SARS-COV-2, NAA 2 DAY TAT

## 2020-07-20 LAB — NOVEL CORONAVIRUS, NAA: SARS-CoV-2, NAA: NOT DETECTED

## 2022-07-28 DIAGNOSIS — R1031 Right lower quadrant pain: Secondary | ICD-10-CM | POA: Diagnosis not present

## 2022-11-19 DIAGNOSIS — E039 Hypothyroidism, unspecified: Secondary | ICD-10-CM | POA: Diagnosis not present

## 2022-11-19 DIAGNOSIS — F5109 Other insomnia not due to a substance or known physiological condition: Secondary | ICD-10-CM | POA: Diagnosis not present

## 2022-12-22 DIAGNOSIS — Z1231 Encounter for screening mammogram for malignant neoplasm of breast: Secondary | ICD-10-CM | POA: Diagnosis not present

## 2023-05-27 DIAGNOSIS — Z0000 Encounter for general adult medical examination without abnormal findings: Secondary | ICD-10-CM | POA: Diagnosis not present

## 2023-05-27 DIAGNOSIS — B009 Herpesviral infection, unspecified: Secondary | ICD-10-CM | POA: Diagnosis not present

## 2023-05-27 DIAGNOSIS — E039 Hypothyroidism, unspecified: Secondary | ICD-10-CM | POA: Diagnosis not present

## 2023-05-27 DIAGNOSIS — E7800 Pure hypercholesterolemia, unspecified: Secondary | ICD-10-CM | POA: Diagnosis not present

## 2023-05-27 DIAGNOSIS — F5109 Other insomnia not due to a substance or known physiological condition: Secondary | ICD-10-CM | POA: Diagnosis not present

## 2023-11-30 DIAGNOSIS — F5109 Other insomnia not due to a substance or known physiological condition: Secondary | ICD-10-CM | POA: Diagnosis not present

## 2023-11-30 DIAGNOSIS — B009 Herpesviral infection, unspecified: Secondary | ICD-10-CM | POA: Diagnosis not present

## 2023-11-30 DIAGNOSIS — E7800 Pure hypercholesterolemia, unspecified: Secondary | ICD-10-CM | POA: Diagnosis not present

## 2023-11-30 DIAGNOSIS — E039 Hypothyroidism, unspecified: Secondary | ICD-10-CM | POA: Diagnosis not present

## 2024-03-29 ENCOUNTER — Other Ambulatory Visit (HOSPITAL_COMMUNITY)
Admission: RE | Admit: 2024-03-29 | Discharge: 2024-03-29 | Disposition: A | Discharge status: Home / Self Care | Source: Ambulatory Visit | Attending: Obstetrics and Gynecology | Admitting: Obstetrics and Gynecology

## 2024-03-29 ENCOUNTER — Ambulatory Visit: Admitting: Obstetrics and Gynecology

## 2024-03-29 ENCOUNTER — Encounter: Payer: Self-pay | Admitting: Obstetrics and Gynecology

## 2024-03-29 VITALS — BP 119/78 | HR 64 | Ht 69.0 in | Wt 197.0 lb

## 2024-03-29 DIAGNOSIS — Z1151 Encounter for screening for human papillomavirus (HPV): Secondary | ICD-10-CM | POA: Insufficient documentation

## 2024-03-29 DIAGNOSIS — Z01419 Encounter for gynecological examination (general) (routine) without abnormal findings: Secondary | ICD-10-CM

## 2024-03-29 DIAGNOSIS — Z124 Encounter for screening for malignant neoplasm of cervix: Secondary | ICD-10-CM

## 2024-03-29 NOTE — Progress Notes (Signed)
 Annual.   Last PAP 2017; never had abnormal. No history of cervical cancer in family.   Mammogram done last year per pt. Colonoscopy in 2022. Both normal.

## 2024-03-29 NOTE — Progress Notes (Signed)
 ANNUAL EXAM Patient name: Barbara Black MRN [id number]  Date of birth: Apr 04, 1956 Chief Complaint:   No chief complaint on file.  History of Present Illness:   Barbara Black is a 68 y.o. G24P2002  female being seen today for a routine annual exam.  Current complaints: Pap smear, breast exam  Patient's last menstrual period was 04/13/2007 (lmp unknown).   The pregnancy intention screening data noted above was reviewed. Potential methods of contraception were discussed. The patient elected to proceed with No data recorded.   Gynecologic History Patient is postmenopausal. Last Pap: [facility] 2017. Results were: Normal per patient Last mammogram: [facility] 2024 . Results were:Normal     03/29/2024    2:18 PM  Depression screen PHQ 2/9  Decreased Interest 0  Down, Depressed, Hopeless 0  PHQ - 2 Score 0  Altered sleeping 0  Tired, decreased energy 0  Change in appetite 0  Feeling bad or failure about yourself  0  Trouble concentrating 0  Moving slowly or fidgety/restless 0  Suicidal thoughts 0  PHQ-9 Score 0        03/29/2024    2:19 PM  GAD 7 : Generalized Anxiety Score  Nervous, Anxious, on Edge 0  Control/stop worrying 0  Worry too much - different things 0  Trouble relaxing 0  Restless 0  Easily annoyed or irritable 0  Afraid - awful might happen 0  Total GAD 7 Score 0     Review of Systems:   Pertinent items are noted in HPI Denies any headaches, blurred vision, fatigue, shortness of breath, chest pain, abdominal pain, abnormal vaginal discharge/itching/odor/irritation, problems with periods, bowel movements, urination, or intercourse unless otherwise stated above. Pertinent History Reviewed:  Reviewed past medical,surgical, social and family history.  Reviewed problem list, medications and allergies. Physical Assessment:   Vitals:   03/29/24 1412  BP: 119/78  Pulse: 64  Weight: 89.4 kg  Height: 5' 9 (1.753 m)  Body mass index is 29.09 kg/m.         Physical Examination:   General appearance - well appearing, and in no distress  Mental status - alert, oriented   Psych:  She has a normal mood and affect  Skin - warm and dry, normal color  Chest - effort normal, all lung fields clear to auscultation bilaterally  Breasts - breasts appear normal, no suspicious masses, no skin or nipple changes or axillary nodes  Abdomen - soft, nontender, nondistended  Pelvic - VULVA: normal appearing vulva with no masses, tenderness or lesions  VAGINA: normal appearing vagina with normal color and discharge, no lesions  CERVIX: normal appearing cervix without discharge or lesions, no CMT  Thin prep pap is done with HR HPV cotesting  UTERUS: uterus is felt to be normal size, shape, consistency and nontender   ADNEXA: No adnexal masses or tenderness noted.  Extremities:  No swelling or varicosities noted  Chaperone present for exam  No results found for this or any previous visit (from the past 24 hours).  Assessment & Plan:  1. Well woman exam (Primary) -UTD on mammogram and colonoscopy with PCP - Cytology - PAP( [facility])  2. Cervical cancer screening - Cytology - PAP( [facility])  -Last pap reported in 2017, discussed per guidelines can discontinue screening if results normal today.  Mammogram: in 1 year, or sooner if problems Colonoscopy: per GI, or sooner if problems  No orders of the defined types were placed in this encounter.   Meds:  No orders of the defined types were placed in this encounter.   Follow-up: No follow-ups on file.  Rolin GORMAN Amel, RN 03/29/2024 2:50 PM

## 2024-04-03 LAB — CYTOLOGY - PAP
Adequacy: ABSENT
Comment: NEGATIVE
Diagnosis: NEGATIVE
High risk HPV: NEGATIVE

## 2024-04-04 DIAGNOSIS — L858 Other specified epidermal thickening: Secondary | ICD-10-CM | POA: Diagnosis not present

## 2024-04-04 DIAGNOSIS — D2362 Other benign neoplasm of skin of left upper limb, including shoulder: Secondary | ICD-10-CM | POA: Diagnosis not present

## 2024-04-04 DIAGNOSIS — D1801 Hemangioma of skin and subcutaneous tissue: Secondary | ICD-10-CM | POA: Diagnosis not present

## 2024-04-04 DIAGNOSIS — L821 Other seborrheic keratosis: Secondary | ICD-10-CM | POA: Diagnosis not present

## 2024-04-05 ENCOUNTER — Ambulatory Visit: Payer: Self-pay | Admitting: Obstetrics and Gynecology

## 2024-05-18 DIAGNOSIS — K080 Exfoliation of teeth due to systemic causes: Secondary | ICD-10-CM | POA: Diagnosis not present
